# Patient Record
Sex: Female | Born: 1985 | Race: Black or African American | Hispanic: No | Marital: Single | Smoking: Current every day smoker
Health system: Southern US, Community
[De-identification: ages and names within clinical notes are randomized; demographics above are authoritative.]

---

## 2006-05-08 ENCOUNTER — Ambulatory Visit: Payer: Self-pay | Admitting: Gynecology

## 2006-05-08 ENCOUNTER — Encounter (INDEPENDENT_AMBULATORY_CARE_PROVIDER_SITE_OTHER): Payer: Self-pay | Admitting: *Deleted

## 2008-04-05 ENCOUNTER — Emergency Department (HOSPITAL_COMMUNITY): Admission: EM | Admit: 2008-04-05 | Discharge: 2008-04-05 | Payer: Self-pay | Admitting: Emergency Medicine

## 2018-05-28 DIAGNOSIS — L723 Sebaceous cyst: Secondary | ICD-10-CM | POA: Insufficient documentation

## 2018-06-04 ENCOUNTER — Other Ambulatory Visit: Payer: Self-pay

## 2018-06-04 ENCOUNTER — Encounter (HOSPITAL_BASED_OUTPATIENT_CLINIC_OR_DEPARTMENT_OTHER): Payer: Self-pay | Admitting: *Deleted

## 2018-06-04 NOTE — H&P (Signed)
  Subjective:     Patient ID: Brittany Burnett is a 32 y.o. female.  HPI  Referred by Dr. Sharyn LullHaverstock for evaluation scalp lesion present since laste 2017. History cyst, underwent injection with steroid x 2, patient reports this made worse. Underwent I&D one month ago. Notes mass recurred.   Lives alone. Drives [employer] three days per week.  Review of Systems + scalp mass Remainder 12 point review negative    Objective:   Physical Exam  Constitutional: She is oriented to person, place, and time.  Cardiovascular: Normal rate, regular rhythm and normal heart sounds.   Pulmonary/Chest: Effort normal and breath sounds normal.  Neurological: She is alert and oriented to person, place, and time.  Skin:  Fitzpatrick 6  HEENT: right parietal scalp mass 1.5.x 3.5 cm, no hair over mass itself, no drainage or cellulitis  RUE with two linear flat scars with hyperpigmentation    Assessment:     Cyst right scalp    Plan:     Given size recommend excision in OR. Do not plan to shave hair, discussed scar itself may not grow hair. Discussed will excise some of the redundant skin that has alopecia. Reviewed risks recurrence, infection, seroma, hematoma.  With regards to her UE scars, counseled they are flat and narrow, any revision of scar would offer minimal improvement. Counseled the scar hyperpigmentation is common with her skin type and excision will not improve this.   Plan OP surgery. She does not work on weekends, desires Friday.  Glenna FellowsBrinda Canna Nickelson, MD [facility] Plastic [facility] Re[facility]ive Surgery [phone], pin [phone]

## 2018-06-06 ENCOUNTER — Encounter (HOSPITAL_BASED_OUTPATIENT_CLINIC_OR_DEPARTMENT_OTHER)
Admission: RE | Disposition: A | Discharge status: Home / Self Care | Payer: Self-pay | Source: Ambulatory Visit | Attending: Plastic Surgery

## 2018-06-06 ENCOUNTER — Ambulatory Visit (HOSPITAL_BASED_OUTPATIENT_CLINIC_OR_DEPARTMENT_OTHER): Payer: BLUE CROSS/BLUE SHIELD | Admitting: Certified Registered"

## 2018-06-06 ENCOUNTER — Ambulatory Visit (HOSPITAL_BASED_OUTPATIENT_CLINIC_OR_DEPARTMENT_OTHER)
Admission: RE | Admit: 2018-06-06 | Discharge: 2018-06-06 | Disposition: A | Discharge status: Home / Self Care | Payer: BLUE CROSS/BLUE SHIELD | Source: Ambulatory Visit | Attending: Plastic Surgery | Admitting: Plastic Surgery

## 2018-06-06 ENCOUNTER — Encounter (HOSPITAL_BASED_OUTPATIENT_CLINIC_OR_DEPARTMENT_OTHER): Payer: Self-pay | Admitting: Certified Registered"

## 2018-06-06 ENCOUNTER — Other Ambulatory Visit: Payer: Self-pay

## 2018-06-06 DIAGNOSIS — F17200 Nicotine dependence, unspecified, uncomplicated: Secondary | ICD-10-CM | POA: Insufficient documentation

## 2018-06-06 DIAGNOSIS — L728 Other follicular cysts of the skin and subcutaneous tissue: Secondary | ICD-10-CM | POA: Insufficient documentation

## 2018-06-06 HISTORY — PX: LESION REMOVAL: SHX5196

## 2018-06-06 LAB — POCT PREGNANCY, URINE: PREG TEST UR: NEGATIVE

## 2018-06-06 SURGERY — WIDE EXCISION, LESION, UPPER EXTREMITY
Anesthesia: General | Site: Scalp

## 2018-06-06 MED ORDER — BUPIVACAINE-EPINEPHRINE 0.25% -1:200000 IJ SOLN
INTRAMUSCULAR | Status: DC | PRN
  Administered 2018-06-06: 8 mL

## 2018-06-06 MED ORDER — HYDROCODONE-ACETAMINOPHEN 5-325 MG PO TABS: 1.0000 | ORAL_TABLET | ORAL | 0 refills | Status: AC | PRN

## 2018-06-06 MED ORDER — LABETALOL HCL 5 MG/ML IV SOLN: 10.0000 mg | INTRAVENOUS | Status: DC | PRN

## 2018-06-06 MED ORDER — MIDAZOLAM HCL 2 MG/2ML IJ SOLN
INTRAMUSCULAR | Status: AC
  Filled 2018-06-06: qty 2

## 2018-06-06 MED ORDER — ACETAMINOPHEN 500 MG PO TABS: 1000.0000 mg | ORAL_TABLET | Freq: Once | ORAL | Status: DC

## 2018-06-06 MED ORDER — SCOPOLAMINE 1 MG/3DAYS TD PT72: 1.0000 | MEDICATED_PATCH | Freq: Once | TRANSDERMAL | Status: DC | PRN

## 2018-06-06 MED ORDER — CEFAZOLIN SODIUM-DEXTROSE 2-4 GM/100ML-% IV SOLN
INTRAVENOUS | Status: AC
  Filled 2018-06-06: qty 100

## 2018-06-06 MED ORDER — ONDANSETRON HCL 4 MG/2ML IJ SOLN
INTRAMUSCULAR | Status: DC | PRN
  Administered 2018-06-06: 4 mg via INTRAVENOUS

## 2018-06-06 MED ORDER — LABETALOL HCL 5 MG/ML IV SOLN
10.0000 mg | INTRAVENOUS | Status: AC | PRN
  Administered 2018-06-06 (×2): 10 mg via INTRAVENOUS

## 2018-06-06 MED ORDER — DEXAMETHASONE SODIUM PHOSPHATE 4 MG/ML IJ SOLN
INTRAMUSCULAR | Status: DC | PRN
  Administered 2018-06-06: 10 mg via INTRAVENOUS

## 2018-06-06 MED ORDER — LABETALOL HCL 5 MG/ML IV SOLN
INTRAVENOUS | Status: AC
  Filled 2018-06-06: qty 4

## 2018-06-06 MED ORDER — BACITRACIN ZINC 500 UNIT/GM EX OINT
TOPICAL_OINTMENT | CUTANEOUS | Status: AC
  Filled 2018-06-06: qty 0.9

## 2018-06-06 MED ORDER — PROMETHAZINE HCL 25 MG/ML IJ SOLN: 6.2500 mg | INTRAMUSCULAR | Status: DC | PRN

## 2018-06-06 MED ORDER — LACTATED RINGERS IV SOLN
INTRAVENOUS | Status: DC
  Administered 2018-06-06 (×2): via INTRAVENOUS

## 2018-06-06 MED ORDER — MIDAZOLAM HCL 2 MG/2ML IJ SOLN
1.0000 mg | INTRAMUSCULAR | Status: DC | PRN
  Administered 2018-06-06: 2 mg via INTRAVENOUS

## 2018-06-06 MED ORDER — 0.9 % SODIUM CHLORIDE (POUR BTL) OPTIME
TOPICAL | Status: DC | PRN
  Administered 2018-06-06: 50 mL

## 2018-06-06 MED ORDER — CEFAZOLIN SODIUM-DEXTROSE 2-4 GM/100ML-% IV SOLN
2.0000 g | INTRAVENOUS | Status: AC
  Administered 2018-06-06: 2 g via INTRAVENOUS

## 2018-06-06 MED ORDER — FENTANYL CITRATE (PF) 100 MCG/2ML IJ SOLN: 25.0000 ug | INTRAMUSCULAR | Status: DC | PRN

## 2018-06-06 MED ORDER — FENTANYL CITRATE (PF) 100 MCG/2ML IJ SOLN
INTRAMUSCULAR | Status: AC
  Filled 2018-06-06: qty 2

## 2018-06-06 MED ORDER — LIDOCAINE HCL (CARDIAC) PF 100 MG/5ML IV SOSY
PREFILLED_SYRINGE | INTRAVENOUS | Status: DC | PRN
  Administered 2018-06-06: 60 mg via INTRAVENOUS

## 2018-06-06 MED ORDER — PROPOFOL 10 MG/ML IV BOLUS
INTRAVENOUS | Status: DC | PRN
  Administered 2018-06-06: 200 mg via INTRAVENOUS

## 2018-06-06 MED ORDER — BACITRACIN 500 UNIT/GM EX OINT
TOPICAL_OINTMENT | CUTANEOUS | Status: DC | PRN
  Administered 2018-06-06: 1 via TOPICAL

## 2018-06-06 MED ORDER — FENTANYL CITRATE (PF) 100 MCG/2ML IJ SOLN
50.0000 ug | INTRAMUSCULAR | Status: DC | PRN
  Administered 2018-06-06: 100 ug via INTRAVENOUS

## 2018-06-06 SURGICAL SUPPLY — 49 items
ADH SKN CLS APL DERMABOND .7 (GAUZE/BANDAGES/DRESSINGS)
BALL CTTN LRG ABS STRL LF (GAUZE/BANDAGES/DRESSINGS)
BLADE CLIPPER SURG (BLADE) IMPLANT
BLADE SURG 15 STRL LF DISP TIS (BLADE) ×1 IMPLANT
BLADE SURG 15 STRL SS (BLADE) ×3
CLOSURE WOUND 1/2 X4 (GAUZE/BANDAGES/DRESSINGS)
CLOSURE WOUND 1/4X4 (GAUZE/BANDAGES/DRESSINGS)
COTTONBALL LRG STERILE PKG (GAUZE/BANDAGES/DRESSINGS) IMPLANT
COVER BACK TABLE 60X90IN (DRAPES) ×3 IMPLANT
COVER MAYO STAND STRL (DRAPES) ×3 IMPLANT
DECANTER SPIKE VIAL GLASS SM (MISCELLANEOUS) IMPLANT
DERMABOND ADVANCED (GAUZE/BANDAGES/DRESSINGS)
DERMABOND ADVANCED .7 DNX12 (GAUZE/BANDAGES/DRESSINGS) IMPLANT
DRAPE U-SHAPE 76X120 STRL (DRAPES) ×3 IMPLANT
ELECT COATED BLADE 2.86 ST (ELECTRODE) IMPLANT
ELECT NDL BLADE 2-5/6 (NEEDLE) ×1 IMPLANT
ELECT NEEDLE BLADE 2-5/6 (NEEDLE) ×3 IMPLANT
ELECT REM PT RETURN 9FT ADLT (ELECTROSURGICAL) ×3
ELECTRODE REM PT RTRN 9FT ADLT (ELECTROSURGICAL) ×1 IMPLANT
GAUZE XEROFORM 1X8 LF (GAUZE/BANDAGES/DRESSINGS) IMPLANT
GAUZE XEROFORM 5X9 LF (GAUZE/BANDAGES/DRESSINGS) IMPLANT
GLOVE BIO SURGEON STRL SZ 6 (GLOVE) ×5 IMPLANT
GLOVE BIO SURGEON STRL SZ 6.5 (GLOVE) IMPLANT
GLOVE BIO SURGEONS STRL SZ 6.5 (GLOVE)
GLOVE EXAM NITRILE LRG STRL (GLOVE) ×2 IMPLANT
GLOVE SURG SS PI 7.0 STRL IVOR (GLOVE) ×4 IMPLANT
GOWN STRL REUS W/ TWL LRG LVL3 (GOWN DISPOSABLE) ×2 IMPLANT
GOWN STRL REUS W/TWL LRG LVL3 (GOWN DISPOSABLE) ×9
NDL PRECISIONGLIDE 27X1.5 (NEEDLE) ×1 IMPLANT
NEEDLE PRECISIONGLIDE 27X1.5 (NEEDLE) ×3 IMPLANT
PACK BASIN DAY SURGERY FS (CUSTOM PROCEDURE TRAY) ×3 IMPLANT
PENCIL BUTTON HOLSTER BLD 10FT (ELECTRODE) ×3 IMPLANT
SHEET MEDIUM DRAPE 40X70 STRL (DRAPES) IMPLANT
SLEEVE SCD COMPRESS KNEE MED (MISCELLANEOUS) ×2 IMPLANT
SPONGE GAUZE 2X2 8PLY STER LF (GAUZE/BANDAGES/DRESSINGS)
SPONGE GAUZE 2X2 8PLY STRL LF (GAUZE/BANDAGES/DRESSINGS) IMPLANT
STRIP CLOSURE SKIN 1/2X4 (GAUZE/BANDAGES/DRESSINGS) IMPLANT
STRIP CLOSURE SKIN 1/4X4 (GAUZE/BANDAGES/DRESSINGS) IMPLANT
SUT CHROMIC 4 0 PS 2 18 (SUTURE) ×4 IMPLANT
SUT MNCRL AB 3-0 PS2 18 (SUTURE) ×2 IMPLANT
SUT PDS AB 2-0 CT2 27 (SUTURE) ×2 IMPLANT
SUT PLAIN 5 0 P 3 18 (SUTURE) IMPLANT
SUT PROLENE 5 0 P 3 (SUTURE) IMPLANT
SUT PROLENE 5 0 PS 2 (SUTURE) IMPLANT
SUT PROLENE 6 0 P 1 18 (SUTURE) IMPLANT
SYR BULB 3OZ (MISCELLANEOUS) IMPLANT
SYR CONTROL 10ML LL (SYRINGE) ×3 IMPLANT
TOWEL GREEN STERILE FF (TOWEL DISPOSABLE) ×3 IMPLANT
TRAY DSU PREP LF (CUSTOM PROCEDURE TRAY) ×3 IMPLANT

## 2018-06-06 NOTE — Interval H&P Note (Signed)
History and Physical Interval Note:  06/06/2018 7:19 AM  Brittany Burnett  has presented today for surgery, with the diagnosis of cyst scalp  The various methods of treatment have been discussed with the patient and family. After consideration of risks, benefits and other options for treatment, the patient has consented to  Excision scalp lesion/cyst as a surgical intervention .  The patient's history has been reviewed, patient examined, no change in status, stable for surgery.  I have reviewed the patient's chart and labs.  Questions were answered to the patient's satisfaction.     [name]

## 2018-06-06 NOTE — Op Note (Signed)
Operative Note   DATE OF OPERATION: 7.26.2019  LOCATION:  [facility]  SURGICAL DIVISION: Plastic Surgery  PREOPERATIVE DIAGNOSES:  Cyst scalp  POSTOPERATIVE DIAGNOSES:  same  PROCEDURE:  1. Excision benign lesion scalp 2.2 cm 2. Layered closure scalp 5 cm  SURGEON: Glenna FellowsBrinda Jsoeph Podesta MD MBA  ASSISTANT: none  ANESTHESIA:  General.   EBL: 20 ml  COMPLICATIONS: None immediate.   INDICATIONS FOR PROCEDURE:  The patient, Brittany Burnett, is a 32 y.o. female born on 05/22/1986, is here for excision cyst scalp with prior steroid injection, infection.    FINDINGS: Right parietal scalp cyst with central punctum, copious white fluid, walls with pseudo epithelization and chronic scar. Additional swelling consistent with cyst noted posterior to lesion of concern, this did not communicate with lesion addressed today.  DESCRIPTION OF PROCEDURE:  The patient's operative site was marked with the patient in the preoperative area. The patient was taken to the operating room. SCDs were placed and IV antibiotics were given. The patient's operative site was prepped and draped in a sterile fashion. A time out was performed and all information was confirmed to be correct. Local anesthetic infiltrated surrounding mass. Elliptical skin excision including punctum completed.Chronic cavity with scar noted and cavity excised to normal appearing subcutaneous tissue. Additional scar excised and redundant skin excised. Cavity irrigated and hemostasis ensured. Additional local anesthetic infiltrated in wound base. Closure completed with 2-0 PDS in galea, 3-0 monocryl in dermis, and running 4-0 chromic for skin closure, length 5 cm. Antibiotic ointment applied.  The patient was allowed to wake from anesthesia, extubated and taken to the recovery room in satisfactory condition.   SPECIMENS: scalp cyst  DRAINS: none  Glenna FellowsBrinda Yeshaya Vath, MD [facility] Re[facility] Surgery [phone], pin [phone]

## 2018-06-06 NOTE — Transfer of Care (Signed)
Immediate Anesthesia Transfer of Care Note  Patient: Brittany Burnett  Procedure(s) Performed: Excision benign lesion scalp 2 cm, layered closure scalp 5 cm (N/A Scalp)  Patient Location: PACU  Anesthesia Type:General  Level of Consciousness: awake, alert , oriented and patient cooperative  Airway & Oxygen Therapy: Patient Spontanous Breathing and Patient connected to face mask oxygen  Post-op Assessment: Report given to RN and Post -op Vital signs reviewed and stable  Post vital signs: Reviewed and stable  Last Vitals:  Vitals Value Taken Time  BP    Temp    Pulse 148 06/06/2018  9:40 AM  Resp 20 06/06/2018  9:40 AM  SpO2 100 % 06/06/2018  9:40 AM  Vitals shown include unvalidated device data.  Last Pain:  Vitals:   06/06/18 0754  TempSrc: Oral  PainSc: 0-No pain      Patients Stated Pain Goal: 3 (06/06/18 0754)  Complications: No apparent anesthesia complications

## 2018-06-06 NOTE — Anesthesia Procedure Notes (Signed)
Procedure Name: LMA Insertion Date/Time: 06/06/2018 8:42 AM Performed by: Sheryn BisonBlocker, Tanji Storrs D, CRNA Pre-anesthesia Checklist: Patient identified, Emergency Drugs available, Suction available and Patient being monitored Patient Re-evaluated:Patient Re-evaluated prior to induction Oxygen Delivery Method: Circle system utilized Preoxygenation: Pre-oxygenation with 100% oxygen Induction Type: IV induction Ventilation: Mask ventilation without difficulty LMA: LMA inserted LMA Size: 4.0 Number of attempts: 1 Airway Equipment and Method: Bite block Placement Confirmation: positive ETCO2 Tube secured with: Tape Dental Injury: Teeth and Oropharynx as per pre-operative assessment

## 2018-06-06 NOTE — Anesthesia Preprocedure Evaluation (Addendum)
Anesthesia Evaluation  Patient identified by MRN, date of birth, ID band Patient awake    Reviewed: Allergy & Precautions, NPO status , Patient's Chart, lab work & pertinent test results  Airway Mallampati: II  TM Distance: >3 FB Neck ROM: Full    Dental  (+) Teeth Intact, Dental Advisory Given   Pulmonary Current Smoker,    Pulmonary exam normal breath sounds clear to auscultation       Cardiovascular Exercise Tolerance: Good negative cardio ROS Normal cardiovascular exam Rhythm:Regular Rate:Normal     Neuro/Psych negative neurological ROS  negative psych ROS   GI/Hepatic negative GI ROS, Neg liver ROS,   Endo/Other  negative endocrine ROSObesity   Renal/GU negative Renal ROS     Musculoskeletal negative musculoskeletal ROS (+)   Abdominal   Peds  Hematology   Anesthesia Other Findings Day of surgery medications reviewed with the patient.  Reproductive/Obstetrics negative OB ROS                            Anesthesia Physical Anesthesia Plan  ASA: II  Anesthesia Plan: General   Post-op Pain Management:    Induction: Intravenous  PONV Risk Score and Plan: 2 and Dexamethasone and Ondansetron  Airway Management Planned: LMA  Additional Equipment:   Intra-op Plan:   Post-operative Plan: Extubation in OR  Informed Consent: I have reviewed the patients History and Physical, chart, labs and discussed the procedure including the risks, benefits and alternatives for the proposed anesthesia with the patient or authorized representative who has indicated his/her understanding and acceptance.   Dental advisory given  Plan Discussed with: CRNA  Anesthesia Plan Comments:         Anesthesia Quick Evaluation

## 2018-06-06 NOTE — Discharge Instructions (Signed)

## 2018-06-06 NOTE — Anesthesia Postprocedure Evaluation (Signed)
Anesthesia Post Note  Patient: Brittany Burnett  Procedure(s) Performed: Excision benign lesion scalp 2 cm, layered closure scalp 5 cm (N/A Scalp)     Patient location during evaluation: PACU Anesthesia Type: General Level of consciousness: awake and alert Pain management: pain level controlled Vital Signs Assessment: post-procedure vital signs reviewed and stable Respiratory status: spontaneous breathing, nonlabored ventilation and respiratory function stable Cardiovascular status: blood pressure returned to baseline and stable Postop Assessment: no apparent nausea or vomiting Anesthetic complications: no    Last Vitals:  Vitals:   06/06/18 1048 06/06/18 1115  BP: (!) 132/96 (!) 138/94  Pulse: 80 75  Resp: 16 20  Temp:  37 C  SpO2: 100% 100%    Last Pain:  Vitals:   06/06/18 1100  TempSrc:   PainSc: 0-No pain                 Cecile HearingStephen Edward Nahla Lukin

## 2018-06-08 ENCOUNTER — Encounter (HOSPITAL_COMMUNITY): Payer: Self-pay | Admitting: Emergency Medicine

## 2018-06-08 ENCOUNTER — Ambulatory Visit (HOSPITAL_COMMUNITY)
Admission: EM | Admit: 2018-06-08 | Discharge: 2018-06-08 | Disposition: A | Discharge status: Home / Self Care | Payer: BLUE CROSS/BLUE SHIELD | Attending: Internal Medicine | Admitting: Internal Medicine

## 2018-06-08 DIAGNOSIS — Z5189 Encounter for other specified aftercare: Secondary | ICD-10-CM

## 2018-06-08 DIAGNOSIS — S0000XA Unspecified superficial injury of scalp, initial encounter: Secondary | ICD-10-CM

## 2018-06-08 NOTE — ED Triage Notes (Signed)
Pt states "Friday I had an outpatient procedure to have a cyst removed from my scalp, and I slept on the R side fo my head and may have opened the incision. They closed it with dissolvable sutures."

## 2018-06-08 NOTE — ED Provider Notes (Signed)
[facility]    CSN: [id number] Arrival date & time: 06/08/18  1010     History   Chief Complaint Chief Complaint  Patient presents with  . Wound Check    HPI Brittany Burnett is a 32 y.o. female.   History of tobacco use presenting today for evaluation of a wound.  Patient states that on Friday, approximately 2 days ago she had excision of a cyst to her scalp.  Wound was closed with absorbable sutures.  Patient felt like she slept on the right side of her head on Saturday, and woke up this morning with blood on her pillow and concerned that she messed up the sutures.  She has had minimal pain.  Denies any fevers.  Denies headaches.     History reviewed. No pertinent past medical history.  There are no active problems to display for this patient.   History reviewed. No pertinent surgical history.  OB History   None      Home Medications    Prior to Admission medications   Medication Sig Start Date End Date Taking? Authorizing Provider  HYDROcodone-acetaminophen (NORCO/VICODIN) 5-325 MG tablet Take 1 tablet by mouth every 4 (four) hours as needed for moderate pain. 06/06/18 06/06/19  Glenna Fellows, MD  ibuprofen (ADVIL,MOTRIN) 100 MG/5ML suspension Take 200 mg by mouth every 4 (four) hours as needed for mild pain or moderate pain.    [provider]    Family History No family history on file.  Social History Social History   Tobacco Use  . Smoking status: Current Every Day Smoker    Packs/day: 0.25    Types: Cigarettes  . Smokeless tobacco: Never Used  . Tobacco comment: Smoke 1 pack a week  Substance Use Topics  . Alcohol use: Yes    Comment: socially  . Drug use: Never     Allergies   Patient has no known allergies.   Review of Systems Review of Systems  Constitutional: Negative for fatigue and fever.  Eyes: Negative for visual disturbance.  Respiratory: Negative for shortness of breath.   Cardiovascular: Negative for chest  pain.  Gastrointestinal: Negative for abdominal pain, nausea and vomiting.  Musculoskeletal: Negative for arthralgias and joint swelling.  Skin: Positive for wound. Negative for color change and rash.  Neurological: Negative for dizziness, weakness, light-headedness and headaches.     Physical Exam Triage Vital Signs ED Triage Vitals [06/08/18 1101]  Enc Vitals Group     BP (!) 153/104     Pulse Rate 60     Resp 18     Temp 97.9 F (36.6 C)     Temp src      SpO2 100 %     Weight      Height      Head Circumference      Peak Flow      Pain Score      Pain Loc      Pain Edu?      Excl. in GC?    No data found.  Updated Vital Signs BP (!) 153/104   Pulse 60   Temp 97.9 F (36.6 C)   Resp 18   LMP 05/27/2018 (Exact Date)   SpO2 100%   Visual Acuity Right Eye Distance:   Left Eye Distance:   Bilateral Distance:    Right Eye Near:   Left Eye Near:    Bilateral Near:     Physical Exam  Constitutional: She is oriented to  person, place, and time. She appears well-developed and well-nourished.  No acute distress  HENT:  Head: Normocephalic and atraumatic.  Nose: Nose normal.  Eyes: Conjunctivae are normal.  Neck: Neck supple.  Cardiovascular: Normal rate.  Pulmonary/Chest: Effort normal. No respiratory distress.  Abdominal: She exhibits no distension.  Musculoskeletal: Normal range of motion.  Neurological: She is alert and oriented to person, place, and time.  Skin: Skin is warm and dry.  3 cm wound to right parietal scalp area, appears to be closed well, scant amount of red crusting drainage to inferior aspect of wound, but not actively draining, mild swelling to superior aspect of wound  Psychiatric: She has a normal mood and affect.  Nursing note and vitals reviewed.      UC Treatments / Results  Labs (all labs ordered are listed, but only abnormal results are displayed) Labs Reviewed - No data to display  EKG None  Radiology No results  found.  Procedures Procedures (including critical care time)  Medications Ordered in UC Medications - No data to display  Initial Impression / Assessment and Plan / UC Course  I have reviewed the triage vital signs and the nursing notes.  Pertinent labs & imaging results that were available during my care of the patient were reviewed by me and considered in my medical decision making (see chart for details).     Wound remains closed, no signs of infection.  Appears to be healing well.  At this time will recommend continuing wound care and continuing to monitor for healing.Discussed strict return precautions. Patient verbalized understanding and is agreeable with plan.  Final Clinical Impressions(s) / UC Diagnoses   Final diagnoses:  Visit for wound check     Discharge Instructions     Please keep clean and dry- wash gently with warm soapy water, dry well but gently after cleaning  Please follow up if having worsening pain, swelling or drainage but otherwise wound appears to be healing well    ED Prescriptions    None     Controlled Substance Prescriptions [place] Controlled Substance Registry consulted? Not Applicable   Lew DawesWieters, Hallie C, [place]-C 06/08/18 1139

## 2018-06-08 NOTE — Discharge Instructions (Signed)
Please keep clean and dry- wash gently with warm soapy water, dry well but gently after cleaning  Please follow up if having worsening pain, swelling or drainage but otherwise wound appears to be healing well

## 2018-06-09 ENCOUNTER — Encounter (HOSPITAL_BASED_OUTPATIENT_CLINIC_OR_DEPARTMENT_OTHER): Payer: Self-pay | Admitting: Plastic Surgery

## 2018-06-17 ENCOUNTER — Encounter (HOSPITAL_COMMUNITY): Payer: Self-pay | Admitting: Emergency Medicine

## 2018-06-17 ENCOUNTER — Ambulatory Visit (HOSPITAL_COMMUNITY)
Admission: EM | Admit: 2018-06-17 | Discharge: 2018-06-17 | Disposition: A | Discharge status: Home / Self Care | Payer: BLUE CROSS/BLUE SHIELD | Attending: Family Medicine | Admitting: Family Medicine

## 2018-06-17 ENCOUNTER — Emergency Department (HOSPITAL_COMMUNITY)
Admission: EM | Admit: 2018-06-17 | Discharge: 2018-06-17 | Disposition: A | Discharge status: Home / Self Care | Payer: BLUE CROSS/BLUE SHIELD | Attending: Emergency Medicine | Admitting: Emergency Medicine

## 2018-06-17 DIAGNOSIS — F17210 Nicotine dependence, cigarettes, uncomplicated: Secondary | ICD-10-CM | POA: Insufficient documentation

## 2018-06-17 DIAGNOSIS — Z79899 Other long term (current) drug therapy: Secondary | ICD-10-CM | POA: Diagnosis not present

## 2018-06-17 DIAGNOSIS — Z5189 Encounter for other specified aftercare: Secondary | ICD-10-CM | POA: Diagnosis not present

## 2018-06-17 DIAGNOSIS — Y658 Other specified misadventures during surgical and medical care: Secondary | ICD-10-CM | POA: Diagnosis not present

## 2018-06-17 DIAGNOSIS — L02811 Cutaneous abscess of head [any part, except face]: Secondary | ICD-10-CM | POA: Insufficient documentation

## 2018-06-17 DIAGNOSIS — S0100XD Unspecified open wound of scalp, subsequent encounter: Secondary | ICD-10-CM | POA: Diagnosis not present

## 2018-06-17 DIAGNOSIS — T8131XA Disruption of external operation (surgical) wound, not elsewhere classified, initial encounter: Secondary | ICD-10-CM | POA: Diagnosis present

## 2018-06-17 DIAGNOSIS — L0291 Cutaneous abscess, unspecified: Secondary | ICD-10-CM

## 2018-06-17 MED ORDER — SULFAMETHOXAZOLE-TRIMETHOPRIM 800-160 MG PO TABS: 1.0000 | ORAL_TABLET | Freq: Two times a day (BID) | ORAL | 0 refills | Status: AC

## 2018-06-17 NOTE — ED Triage Notes (Signed)
Pt here for have wound check from cyst removed from back of head

## 2018-06-17 NOTE — ED Notes (Signed)
Declined W/C at D/C and was escorted to lobby by RN. 

## 2018-06-17 NOTE — ED Triage Notes (Signed)
Patient requesting incision at right scalp sutured , she noticed small gap at incision site with no bleeding ( cyst excision 7/26) this morning .

## 2018-06-17 NOTE — Discharge Instructions (Addendum)
Warm compresses to area for 20 minutes at a time. Take antibiotics as prescribed and complete the full course. Recheck with your PCP or surgeon.

## 2018-06-17 NOTE — Discharge Instructions (Addendum)
Continue antibiotic ointment, also allow period of time exposed to air  Return if developing pain, increased drainage from area

## 2018-06-17 NOTE — ED Provider Notes (Addendum)
[facility]    CSN: [id number] Arrival date & time: 06/17/18  1847     History   Chief Complaint Chief Complaint  Patient presents with  . Wound Check    HPI Brittany Burnett is a 32 y.o. female no significant past medical history presenting today for evaluation of a wound check.  Patient had a cyst removed from her scalp approximately 1-1/2 weeks ago.  She is presenting today she is concerned about the wound.  She states that yesterday she was seen by her surgeon and advised to apply antibiotic ointment as well as continue wound care and advised that the wound is healing well.  She states that this morning she was in a hurry and pulled her hair back, she is concerned that when she did that she opened the wound further and has also noticed that wound to be "wet".  She denies any associated pain with this wound.  HPI  History reviewed. No pertinent past medical history.  There are no active problems to display for this patient.   Past Surgical History:  Procedure Laterality Date  . LESION REMOVAL N/A 06/06/2018   Procedure: Excision benign lesion scalp 2 cm, layered closure scalp 5 cm;  Surgeon: Glenna Fellowshimmappa, Brinda, MD;  Location: [facility];  Service: Plastics;  Laterality: N/A;    OB History   None      Home Medications    Prior to Admission medications   Medication Sig Start Date End Date Taking? Authorizing Provider  HYDROcodone-acetaminophen (NORCO/VICODIN) 5-325 MG tablet Take 1 tablet by mouth every 4 (four) hours as needed for moderate pain. 06/06/18 06/06/19  Glenna Fellowshimmappa, Brinda, MD  ibuprofen (ADVIL,MOTRIN) 100 MG/5ML suspension Take 200 mg by mouth every 4 (four) hours as needed for mild pain or moderate pain.    [provider]    Family History History reviewed. No pertinent family history.  Social History Social History   Tobacco Use  . Smoking status: Current Every Day Smoker    Packs/day: 0.25    Types: Cigarettes  .  Smokeless tobacco: Never Used  . Tobacco comment: Smoke 1 pack a week  Substance Use Topics  . Alcohol use: Yes    Comment: socially  . Drug use: Never     Allergies   Patient has no known allergies.   Review of Systems Review of Systems  Constitutional: Negative for fatigue and fever.  HENT: Negative for mouth sores.   Eyes: Negative for visual disturbance.  Respiratory: Negative for shortness of breath.   Cardiovascular: Negative for chest pain.  Gastrointestinal: Negative for abdominal pain, nausea and vomiting.  Genitourinary: Negative for genital sores.  Musculoskeletal: Negative for arthralgias and joint swelling.  Skin: Positive for wound. Negative for color change and rash.  Neurological: Negative for dizziness, weakness, light-headedness and headaches.     Physical Exam Triage Vital Signs ED Triage Vitals  Enc Vitals Group     BP 06/17/18 1937 (!) 156/105     Pulse Rate 06/17/18 1937 91     Resp 06/17/18 1937 18     Temp 06/17/18 1937 98.2 F (36.8 C)     Temp Source 06/17/18 1937 Oral     SpO2 06/17/18 1937 97 %     Weight --      Height --      Head Circumference --      Peak Flow --      Pain Score 06/17/18 1935 0  Pain Loc --      Pain Edu? --      Excl. in GC? --    No data found.  Updated Vital Signs BP (!) 156/105 (BP Location: Left Arm)   Pulse 91   Temp 98.2 F (36.8 C) (Oral)   Resp 18   LMP 05/27/2018 (Exact Date)   SpO2 97%   Visual Acuity Right Eye Distance:   Left Eye Distance:   Bilateral Distance:    Right Eye Near:   Left Eye Near:    Bilateral Near:     Physical Exam  Constitutional: She is oriented to person, place, and time. She appears well-developed and well-nourished.  No acute distress  HENT:  Head: Normocephalic and atraumatic.  Nose: Nose normal.  Eyes: Conjunctivae are normal.  Neck: Neck supple.  Cardiovascular: Normal rate.  Pulmonary/Chest: Effort normal. No respiratory distress.  Abdominal: She  exhibits no distension.  Musculoskeletal: Normal range of motion.  Neurological: She is alert and oriented to person, place, and time.  Skin: Skin is warm and dry.  Overall well-healing wound to right parietal scalp, scabbing along most of the wound, 1 and with slight opening, no tenderness to palpation, no active drainage expressed  Psychiatric: She has a normal mood and affect.  Nursing note and vitals reviewed.    See previous note for picture of wound 3 days after surgery.  UC Treatments / Results  Labs (all labs ordered are listed, but only abnormal results are displayed) Labs Reviewed - No data to display  EKG None  Radiology No results found.  Procedures Procedures (including critical care time)  Medications Ordered in UC Medications - No data to display  Initial Impression / Assessment and Plan / UC Course  I have reviewed the triage vital signs and the nursing notes.  Pertinent labs & imaging results that were available during my care of the patient were reviewed by me and considered in my medical decision making (see chart for details).     Wound to right scalp, no obvious sign of infection although opening appears increased from previous visit, given surgery approximately 1-1/2 weeks out, do not recommend putting further stitches in, advised patient that this wound should still continue to heal.  Continue to apply bacitracin/antibiotic ointment to wound.  Follow-up if developing drainage, pain, fevers. Final Clinical Impressions(s) / UC Diagnoses   Final diagnoses:  Visit for wound check     Discharge Instructions     Continue antibiotic ointment, also allow period of time exposed to air  Return if developing pain, increased drainage from area   ED Prescriptions    None     Controlled Substance Prescriptions [place] Controlled Substance Registry consulted? Not Applicable   Lew Dawes, PA-C 06/17/18 2031    Lew Dawes, [place] 06/17/18  2034

## 2018-06-17 NOTE — ED Provider Notes (Signed)
[facility] EMERGENCY [facility] Provider Note   CSN: [id number] Arrival date & time: 06/17/18  2016     History   Chief Complaint Chief Complaint  Patient presents with  . Wound Check    HPI Brittany Burnett is a 32 y.o. female.  32 year old female presents with complaint of open wound on her scalp.  Patient states that she had a cyst removed from her scalp about a week and a half ago and today while putting her hair up noticed the incision site opened.  Patient went to urgent care tonight and was told that the wound could not be sutured so she came to the emergency room.  No other complaints or concerns.     History reviewed. No pertinent past medical history.  There are no active problems to display for this patient.   Past Surgical History:  Procedure Laterality Date  . LESION REMOVAL N/A 06/06/2018   Procedure: Excision benign lesion scalp 2 cm, layered closure scalp 5 cm;  Surgeon: Glenna Fellowshimmappa, Brinda, MD;  Location: [facility];  Service: Plastics;  Laterality: N/A;     OB History   None      Home Medications    Prior to Admission medications   Medication Sig Start Date End Date Taking? Authorizing Provider  HYDROcodone-acetaminophen (NORCO/VICODIN) 5-325 MG tablet Take 1 tablet by mouth every 4 (four) hours as needed for moderate pain. 06/06/18 06/06/19  Glenna Fellowshimmappa, Brinda, MD  ibuprofen (ADVIL,MOTRIN) 100 MG/5ML suspension Take 200 mg by mouth every 4 (four) hours as needed for mild pain or moderate pain.    [provider]  sulfamethoxazole-trimethoprim (BACTRIM DS,SEPTRA DS) 800-160 MG tablet Take 1 tablet by mouth 2 (two) times daily for 10 days. 06/17/18 06/27/18  Jeannie FendMurphy, Keland Peyton A, PA-C    Family History No family history on file.  Social History Social History   Tobacco Use  . Smoking status: Current Every Day Smoker    Packs/day: 0.25    Types: Cigarettes  . Smokeless tobacco: Never Used  . Tobacco comment: Smoke 1  pack a week  Substance Use Topics  . Alcohol use: Yes    Comment: socially  . Drug use: Never     Allergies   Patient has no known allergies.   Review of Systems Review of Systems  Constitutional: Negative for fever.  Skin: Positive for wound.  Allergic/Immunologic: Negative for immunocompromised state.  Neurological: Negative for headaches.  Hematological: Does not bruise/bleed easily.  Psychiatric/Behavioral: Negative for confusion.  All other systems reviewed and are negative.    Physical Exam Updated Vital Signs BP (!) 136/108 (BP Location: Right Arm)   Pulse 87   Temp 99.7 F (37.6 C) (Oral)   Resp 18   LMP 05/27/2018 (Exact Date)   SpO2 100%   Physical Exam  Constitutional: She is oriented to person, place, and time. She appears well-developed and well-nourished. No distress.  HENT:  Head: Atraumatic.    Pulmonary/Chest: Effort normal.  Neurological: She is alert and oriented to person, place, and time.  Skin: Skin is warm and dry. She is not diaphoretic.  Psychiatric: She has a normal mood and affect. Her behavior is normal.  Nursing note and vitals reviewed.    ED Treatments / Results  Labs (all labs ordered are listed, but only abnormal results are displayed) Labs Reviewed - No data to display  EKG None  Radiology No results found.  Procedures Procedures (including critical care time)  Medications Ordered in  ED Medications - No data to display   Initial Impression / Assessment and Plan / ED Course  I have reviewed the triage vital signs and the nursing notes.  Pertinent labs & imaging results that were available during my care of the patient were reviewed by me and considered in my medical decision making (see chart for details).  Clinical Course as of Jun 18 2127  Tue Jun 17, 2018  4822 32 year old female with wound to right parietal scalp.  Patient had a cyst removed a week and a half ago, now with wound dehiscence and purulent  drainage.  Recommend warm compresses, patient given Septra.  Recommend recheck with her surgeon or PCP in 2 days.   [LM]    Clinical Course User Index [LM] Jeannie Fend, PA-C   Final Clinical Impressions(s) / ED Diagnoses   Final diagnoses:  Abscess    ED Discharge Orders        Ordered    sulfamethoxazole-trimethoprim (BACTRIM DS,SEPTRA DS) 800-160 MG tablet  2 times daily     06/17/18 2124       Jeannie Fend, PA-C 06/17/18 2128    Pricilla Loveless, MD 06/19/18 2355

## 2018-10-07 ENCOUNTER — Emergency Department (HOSPITAL_COMMUNITY): Payer: BLUE CROSS/BLUE SHIELD

## 2018-10-07 ENCOUNTER — Other Ambulatory Visit: Payer: Self-pay

## 2018-10-07 ENCOUNTER — Encounter (HOSPITAL_COMMUNITY): Payer: Self-pay | Admitting: Emergency Medicine

## 2018-10-07 ENCOUNTER — Emergency Department (HOSPITAL_COMMUNITY)
Admission: EM | Admit: 2018-10-07 | Discharge: 2018-10-07 | Disposition: A | Discharge status: Home / Self Care | Payer: BLUE CROSS/BLUE SHIELD | Attending: Emergency Medicine | Admitting: Emergency Medicine

## 2018-10-07 DIAGNOSIS — Y998 Other external cause status: Secondary | ICD-10-CM | POA: Insufficient documentation

## 2018-10-07 DIAGNOSIS — T148XXA Other injury of unspecified body region, initial encounter: Secondary | ICD-10-CM | POA: Diagnosis not present

## 2018-10-07 DIAGNOSIS — F17210 Nicotine dependence, cigarettes, uncomplicated: Secondary | ICD-10-CM | POA: Insufficient documentation

## 2018-10-07 DIAGNOSIS — Y939 Activity, unspecified: Secondary | ICD-10-CM | POA: Insufficient documentation

## 2018-10-07 DIAGNOSIS — T07XXXA Unspecified multiple injuries, initial encounter: Secondary | ICD-10-CM

## 2018-10-07 DIAGNOSIS — Y929 Unspecified place or not applicable: Secondary | ICD-10-CM | POA: Diagnosis not present

## 2018-10-07 DIAGNOSIS — S61211A Laceration without foreign body of left index finger without damage to nail, initial encounter: Secondary | ICD-10-CM | POA: Insufficient documentation

## 2018-10-07 LAB — POC URINE PREG, ED: PREG TEST UR: NEGATIVE

## 2018-10-07 LAB — RAPID HIV SCREEN (HIV 1/2 AB+AG)
HIV 1/2 ANTIBODIES: NONREACTIVE
HIV-1 P24 ANTIGEN - HIV24: NONREACTIVE

## 2018-10-07 MED ORDER — LIDOCAINE HCL (PF) 1 % IJ SOLN
10.0000 mL | Freq: Once | INTRAMUSCULAR | Status: AC
  Administered 2018-10-07: 10 mL
  Filled 2018-10-07: qty 30

## 2018-10-07 MED ORDER — METRONIDAZOLE 500 MG PO TABS
2000.0000 mg | ORAL_TABLET | Freq: Once | ORAL | Status: AC
  Administered 2018-10-07: 2000 mg via ORAL
  Filled 2018-10-07: qty 4

## 2018-10-07 MED ORDER — LIP MEDEX EX OINT
TOPICAL_OINTMENT | Freq: Once | CUTANEOUS | Status: AC
  Administered 2018-10-07: 1 via TOPICAL
  Filled 2018-10-07: qty 7

## 2018-10-07 MED ORDER — CEFTRIAXONE SODIUM 250 MG IJ SOLR
250.0000 mg | Freq: Once | INTRAMUSCULAR | Status: AC
  Administered 2018-10-07: 250 mg via INTRAMUSCULAR
  Filled 2018-10-07: qty 250

## 2018-10-07 MED ORDER — BACITRACIN ZINC 500 UNIT/GM EX OINT
TOPICAL_OINTMENT | CUTANEOUS | Status: AC
  Administered 2018-10-07: 1
  Filled 2018-10-07: qty 0.9

## 2018-10-07 MED ORDER — IBUPROFEN 200 MG PO TABS
600.0000 mg | ORAL_TABLET | Freq: Once | ORAL | Status: AC
  Administered 2018-10-07: 600 mg via ORAL
  Filled 2018-10-07: qty 3

## 2018-10-07 MED ORDER — IBUPROFEN 600 MG PO TABS: 600.0000 mg | ORAL_TABLET | Freq: Four times a day (QID) | ORAL | 0 refills | Status: AC | PRN

## 2018-10-07 MED ORDER — LIDOCAINE HCL 1 % IJ SOLN
INTRAMUSCULAR | Status: AC
  Administered 2018-10-07: 20 mL via INTRAMUSCULAR
  Filled 2018-10-07: qty 20

## 2018-10-07 MED ORDER — ONDANSETRON 4 MG PO TBDP
4.0000 mg | ORAL_TABLET | Freq: Once | ORAL | Status: AC
  Administered 2018-10-07: 4 mg via ORAL
  Filled 2018-10-07: qty 1

## 2018-10-07 MED ORDER — AZITHROMYCIN 250 MG PO TABS
1000.0000 mg | ORAL_TABLET | Freq: Once | ORAL | Status: AC
  Administered 2018-10-07: 1000 mg via ORAL
  Filled 2018-10-07: qty 4
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
## 2018-10-07 NOTE — ED Notes (Signed)
Ace wrap applied by ortho tech

## 2018-10-07 NOTE — ED Notes (Signed)
Patient transported to X-ray 

## 2018-10-07 NOTE — ED Notes (Signed)
Patient given discharge instructions. Waiting for [employer] to come to the ED to escort the patient home.
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
## 2018-10-07 NOTE — ED Notes (Signed)
[employer] at bedside will clean wound and obtain vitals when [employer] is finished with pt

## 2018-10-07 NOTE — ED Notes (Signed)
Patient discharged with [employer] escort and [facility] member.

## 2018-10-07 NOTE — SANE Note (Signed)
Follow-up Phone Call  Patient gives verbal consent for a FNE/SANE follow-up phone call in 48-72 hours: yes Patient's telephone number: [phone] - phone belongs to patient's aunt Patient gives verbal consent to leave voicemail at the phone number listed above: yes DO NOT CALL between the hours of: No time restrictions given
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
## 2018-10-07 NOTE — ED Notes (Signed)
[employer] here to interview patient
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
## 2018-10-08 LAB — RPR: RPR: NONREACTIVE

## 2018-10-14 ENCOUNTER — Encounter: Payer: Self-pay | Admitting: *Deleted

## 2018-10-21 ENCOUNTER — Ambulatory Visit: Payer: BLUE CROSS/BLUE SHIELD | Admitting: Internal Medicine

## 2018-10-27 NOTE — SANE Note (Signed)
[facility]NE attempted to contact patient for follow-up.  Left message with Forensic Nursing office call back number in case of questions.

## 2019-12-01 DIAGNOSIS — L663 Perifolliculitis capitis abscedens: Secondary | ICD-10-CM | POA: Insufficient documentation

## 2019-12-01 DIAGNOSIS — L700 Acne vulgaris: Secondary | ICD-10-CM | POA: Insufficient documentation
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
## 2021-04-27 ENCOUNTER — Other Ambulatory Visit: Payer: Self-pay | Admitting: Obstetrics & Gynecology

## 2021-05-04 ENCOUNTER — Ambulatory Visit: Payer: 59 | Admitting: Sports Medicine

## 2022-03-07 ENCOUNTER — Ambulatory Visit (INDEPENDENT_AMBULATORY_CARE_PROVIDER_SITE_OTHER): Payer: 59

## 2022-03-07 ENCOUNTER — Ambulatory Visit: Payer: 59 | Admitting: Podiatry

## 2022-03-07 DIAGNOSIS — M21612 Bunion of left foot: Secondary | ICD-10-CM

## 2022-03-07 DIAGNOSIS — Z01818 Encounter for other preprocedural examination: Secondary | ICD-10-CM

## 2022-03-07 DIAGNOSIS — M21619 Bunion of unspecified foot: Secondary | ICD-10-CM

## 2022-03-07 DIAGNOSIS — M2011 Hallux valgus (acquired), right foot: Secondary | ICD-10-CM

## 2022-03-08 ENCOUNTER — Other Ambulatory Visit: Payer: Self-pay | Admitting: Podiatry

## 2022-03-08 DIAGNOSIS — M2011 Hallux valgus (acquired), right foot: Secondary | ICD-10-CM

## 2022-03-09 NOTE — Progress Notes (Signed)
?Subjective:  ?Patient ID: Brittany Burnett, female    DOB: 1986/03/21,  MRN: [id number] ? ?No chief complaint on file. ? ? ?36 y.o. female presents with the above complaint.  Patient presents with complaint bilateral bunion deformity right greater than left side.  Patient states has progressive gotten worse.  She states that the bump is started to hurt she has tried all conservative treatment options including padding protecting shoe gear modification.  She would like to discuss surgical options at this time.  She has failed all conservative treatment options.  Pain scale is 7 out of 10 hurts with ambulation ? ?Review of Systems: Negative except as noted in the HPI. Denies N/V/F/Ch. ? ?No past medical history on file. ? ?Current Outpatient Medications:  ?  cephALEXin (KEFLEX) 500 MG capsule, cephalexin 500 mg capsule  TAKE 1 CAPSULE BY MOUTH EVERY 12 HOURS, Disp: , Rfl:  ?  clindamycin (CLEOCIN T) 1 % external solution, Apply a thin layer to the face/chest/back every morning. Do not rinse off., Disp: , Rfl:  ?  HYDROcodone-acetaminophen (NORCO/VICODIN) 5-325 MG tablet, hydrocodone 5 mg-acetaminophen 325 mg tablet, Disp: , Rfl:  ?  ibuprofen (ADVIL) 800 MG tablet, ibuprofen 800 mg tablet, Disp: , Rfl:  ?  ibuprofen (ADVIL,MOTRIN) 600 MG tablet, Take 1 tablet (600 mg total) by mouth every 6 (six) hours as needed., Disp: 30 tablet, Rfl: 0 ?  metroNIDAZOLE (METROGEL) 0.75 % vaginal gel, metronidazole 0.75 % vaginal gel, Disp: , Rfl:  ?  Multiple Vitamin (MULTIVITAMIN WITH MINERALS) TABS tablet, Take 1 tablet by mouth daily., Disp: , Rfl:  ?  multivitamin (VIT W/EXTRA C) CHEW chewable tablet, Chew by mouth., Disp: , Rfl:  ?  paragard intrauterine copper IUD IUD, by Intrauterine route., Disp: , Rfl:  ?  spironolactone (ALDACTONE) 50 MG tablet, spironolactone 50 mg tablet, Disp: , Rfl:  ?  tretinoin (RETIN-A) 0.025 % cream, Apply a thin layer to the face every night. May decrease to every other night if too drying., Disp: ,  Rfl:  ? ?Social History  ? ?Tobacco Use  ?Smoking Status Every Day  ? Packs/day: 0.25  ? Types: Cigarettes  ?Smokeless Tobacco Never  ?Tobacco Comments  ? Smoke 1 pack a week  ? ? ?No Known Allergies ?Objective:  ?There were no vitals filed for this visit. ?There is no height or weight on file to calculate BMI. ?Constitutional Well developed. ?Well nourished.  ?Vascular Dorsalis pedis pulses palpable bilaterally. ?Posterior tibial pulses palpable bilaterally. ?Capillary refill normal to all digits.  ?No cyanosis or clubbing noted. ?Pedal hair growth normal.  ?Neurologic Normal speech. ?Oriented to person, place, and time. ?Epicritic sensation to light touch grossly present bilaterally.  ?Dermatologic Nails well groomed and normal in appearance. ?No open wounds. ?No skin lesions.  ?Orthopedic: Normal joint ROM without pain or crepitus bilaterally. ?Hallux abductovalgus deformity present this is a tracking not a track bound deformity.  No intra-articular first MPJ pain noted. ?Left 1st MPJ diminished range of motion. ?Left 1st TMT with gross hypermobility. ?Right 1st MPJ diminished range of motion  ?Right 1st TMT with gross hypermobility. ?Lesser digital contractures absent bilaterally.  ? ?Radiographs: ?Taken and reviewed. Hallux abductovalgus deformity present. Metatarsal parabola normal. 1st/2nd IMA: Severe greater than 15 degrees; TSP: 5 out of 7.  Increase in hallux abductor's angle.  Some mild cystic changes noted to the medial aspect of the first MPJ joint ? ?Assessment:  ? ?1. Hav (hallux abducto valgus), right   ?2. Encounter for preoperative  examination for general surgical procedure   ? ?Plan:  ?Patient was evaluated and treated and all questions answered. ? ?Hallux abductovalgus deformity, right greater than left side ?-XR as above. ?-Patient has failed all conservative therapy and wishes to proceed with surgical intervention. All risks, benefits, and alternatives discussed with patient. No guarantees  given. Consent reviewed and signed by patient. Post-op course explained at length. ?-Planned procedures: Lapiplasty with phalangeal osteotomy with fixation ?-Risk factors: None ?-I discussed my preoperative intraoperative postoperative plan in extensive detail.  Given the amount of pain that she is experienced setting of all failing all conservative treatment options.  I believe she will benefit from Lapiplasty procedure with Lapidus fusion and a possible phalangeal osteotomy.  I discussed my nonweightbearing period which is 3 weeks followed by weightbearing as tolerated in cam boot.  She states understand like to proceed with surgery ?-Informed surgical risk consent was reviewed and read aloud to the patient.  I reviewed the films.  I have discussed my findings with the patient in great detail.  I have discussed all risks including but not limited to infection, stiffness, scarring, limp, disability, deformity, damage to blood vessels and nerves, numbness, poor healing, need for braces, arthritis, chronic pain, amputation, death.  All benefits and realistic expectations discussed in great detail.  I have made no promises as to the outcome.  I have provided realistic expectations.  I have offered the patient a 2nd o-pinion, which they have declined and assured me they preferred to proceed despite the risks ?- ?We will plan on doing the left side in the future time once the right side has healed as part of a staged procedure ?No follow-ups on file. ? ?

## 2022-03-19 ENCOUNTER — Telehealth: Payer: Self-pay | Admitting: Urology

## 2022-03-19 NOTE — Telephone Encounter (Signed)
DOS - 04/02/22 ? ?[employer] BUNIONECTOMY RIGHT --- [phone] ?[facility] OSTEOTOMY RIGHT --- [phone] ? ?[employer] EFFECTIVE DATE - 11/12/21 ? ?PLAN DEDUCTIBLE - $2,000.00 W/ $1,598.31 REMAINING ?OUT OF POCKET - $6,000.00 W/ $5,538.31 REMAINING ?COINSURANCE - 20% ?COPAY - $0.00 ? ? ?PER [employer] WEBSITE FOR CPT CODES 75102 AND [phone] HAS BEEN APPROVED, AUTH # A [id number], GOOD FROM 04/02/22 - 07/01/22. ?

## 2022-03-27 ENCOUNTER — Telehealth: Payer: Self-pay

## 2022-03-27 NOTE — Telephone Encounter (Signed)
Received call from [place] at [facility]. She stated Teylor call them to cancel surgery with Dr. Allena Katz on 04/02/2022. Elnita stated she will contact us when she is ready to reschedule. Notified Dr. Allena Katz ?

## 2022-04-11 ENCOUNTER — Encounter: Payer: 59 | Admitting: Podiatry

## 2022-04-25 ENCOUNTER — Encounter: Payer: 59 | Admitting: Podiatry

## 2024-05-14 DIAGNOSIS — R229 Localized swelling, mass and lump, unspecified: Secondary | ICD-10-CM | POA: Diagnosis not present

## 2024-05-14 DIAGNOSIS — R238 Other skin changes: Secondary | ICD-10-CM | POA: Diagnosis not present

## 2024-05-14 DIAGNOSIS — R52 Pain, unspecified: Secondary | ICD-10-CM | POA: Diagnosis not present

## 2024-05-14 DIAGNOSIS — R21 Rash and other nonspecific skin eruption: Secondary | ICD-10-CM | POA: Diagnosis not present

## 2024-07-15 DIAGNOSIS — Z113 Encounter for screening for infections with a predominantly sexual mode of transmission: Secondary | ICD-10-CM | POA: Diagnosis not present

## 2024-07-29 ENCOUNTER — Ambulatory Visit: Admitting: Podiatry

## 2024-08-05 ENCOUNTER — Ambulatory Visit: Admitting: Podiatry

## 2024-08-19 ENCOUNTER — Ambulatory Visit: Payer: Self-pay | Admitting: Podiatry

## 2024-09-02 ENCOUNTER — Ambulatory Visit: Payer: Self-pay | Admitting: Podiatry

## 2024-09-02 ENCOUNTER — Ambulatory Visit

## 2024-09-02 DIAGNOSIS — M2011 Hallux valgus (acquired), right foot: Secondary | ICD-10-CM

## 2024-09-02 DIAGNOSIS — M21611 Bunion of right foot: Secondary | ICD-10-CM

## 2024-09-02 DIAGNOSIS — M21619 Bunion of unspecified foot: Secondary | ICD-10-CM | POA: Diagnosis not present

## 2024-09-02 NOTE — Progress Notes (Signed)
 Subjective:  Patient ID: Brittany Burnett, female    DOB: 1986-02-27,  MRN: [id number]  Chief Complaint  Patient presents with   Bunions    Bilateral bunions pt stated that her right foot is worse than the left one    38 y.o. female presents with the above complaint.  Patient presents with complaint bilateral bunion deformity right greater than left side.  Patient states has progressive gotten worse.  She states that the bump is started to hurt she has tried all conservative treatment options including padding protecting shoe gear modification.  She would like to discuss surgical options at this time.  She has failed all conservative treatment options.  Pain scale is 7 out of 10 hurts with ambulation  Review of Systems: Negative except as noted in the HPI. Denies N/V/F/Ch.  No past medical history on file.  Current Outpatient Medications:    cephALEXin (KEFLEX) 500 MG capsule, cephalexin 500 mg capsule  TAKE 1 CAPSULE BY MOUTH EVERY 12 HOURS, Disp: , Rfl:    clindamycin (CLEOCIN T) 1 % external solution, Apply a thin layer to the face/chest/back every morning. Do not rinse off., Disp: , Rfl:    HYDROcodone -acetaminophen  (NORCO/VICODIN) 5-325 MG tablet, hydrocodone  5 mg-acetaminophen  325 mg tablet, Disp: , Rfl:    ibuprofen  (ADVIL ) 800 MG tablet, ibuprofen  800 mg tablet, Disp: , Rfl:    ibuprofen  (ADVIL ,MOTRIN ) 600 MG tablet, Take 1 tablet (600 mg total) by mouth every 6 (six) hours as needed., Disp: 30 tablet, Rfl: 0   metroNIDAZOLE  (METROGEL ) 0.75 % vaginal gel, metronidazole  0.75 % vaginal gel, Disp: , Rfl:    Multiple Vitamin (MULTIVITAMIN WITH MINERALS) TABS tablet, Take 1 tablet by mouth daily., Disp: , Rfl:    multivitamin (VIT W/EXTRA C) CHEW chewable tablet, Chew by mouth., Disp: , Rfl:    paragard intrauterine copper IUD IUD, by Intrauterine route., Disp: , Rfl:    spironolactone (ALDACTONE) 50 MG tablet, spironolactone 50 mg tablet, Disp: , Rfl:    tretinoin (RETIN-A) 0.025 % cream,  Apply a thin layer to the face every night. May decrease to every other night if too drying., Disp: , Rfl:   Social History   Tobacco Use  Smoking Status Every Day   Current packs/day: 0.25   Types: Cigarettes  Smokeless Tobacco Never  Tobacco Comments   Smoke 1 pack a week    No Known Allergies Objective:  There were no vitals filed for this visit. There is no height or weight on file to calculate BMI. Constitutional Well developed. Well nourished.  Vascular Dorsalis pedis pulses palpable bilaterally. Posterior tibial pulses palpable bilaterally. Capillary refill normal to all digits.  No cyanosis or clubbing noted. Pedal hair growth normal.  Neurologic Normal speech. Oriented to person, place, and time. Epicritic sensation to light touch grossly present bilaterally.  Dermatologic Nails well groomed and normal in appearance. No open wounds. No skin lesions.  Orthopedic: Normal joint ROM without pain or crepitus bilaterally. Hallux abductovalgus deformity present this is a tracking not a track bound deformity.  No intra-articular first MPJ pain noted. Left 1st MPJ diminished range of motion. Left 1st TMT with gross hypermobility. Right 1st MPJ diminished range of motion  Right 1st TMT with gross hypermobility. Lesser digital contractures absent bilaterally.   Radiographs: Taken and reviewed. Hallux abductovalgus deformity present. Metatarsal parabola normal. 1st/2nd IMA: Severe greater than 15 degrees; TSP: 5 out of 7.  Increase in hallux abductor's angle.  Some mild cystic changes noted to the  medial aspect of the first MPJ joint  Assessment:   1. Bunion   2. Hav (hallux abducto valgus), right    Plan:  Patient was evaluated and treated and all questions answered.  Hallux abductovalgus deformity, right greater than left side -XR as above. -Patient has failed all conservative therapy and wishes to proceed with surgical intervention. All risks, benefits, and  alternatives discussed with patient. No guarantees given. Consent reviewed and signed by patient. Post-op course explained at length. -Planned procedures: Lapiplasty with phalangeal osteotomy with fixation versus minimally invasive bunionectomy with possible phalangeal osteotomy -Risk factors: None -I discussed my preoperative intraoperative postoperative plan in extensive detail.  Given the amount of pain that she is experienced setting of all failing all conservative treatment options.  I believe she will benefit from Lapiplasty procedure with Lapidus fusion and a possible phalangeal osteotomy.  I discussed my nonweightbearing period which is 3 weeks followed by weightbearing as tolerated in cam boot.  She states understand like to proceed with surgery -I discussed minimally invasive bunion surgery for the patient as well she would like to let me know a week or so in advance before the surgery.  Will decide on which procedure to go with.  She states understanding -Informed surgical risk consent was reviewed and read aloud to the patient.  I reviewed the films.  I have discussed my findings with the patient in great detail.  I have discussed all risks including but not limited to infection, stiffness, scarring, limp, disability, deformity, damage to blood vessels and nerves, numbness, poor healing, need for braces, arthritis, chronic pain, amputation, death.  All benefits and realistic expectations discussed in great detail.  I have made no promises as to the outcome.  I have provided realistic expectations.  I have offered the patient a 2nd o-pinion, which they have declined and assured me they preferred to proceed despite the risks - We will plan on doing the left side in the future time once the right side has healed as part of a staged procedure No follow-ups on file.

## 2024-09-10 ENCOUNTER — Telehealth: Payer: Self-pay | Admitting: Podiatry

## 2024-09-10 NOTE — Telephone Encounter (Signed)
 Called and left message for patient to contact office to schedule appointment.

## 2024-09-18 ENCOUNTER — Telehealth: Payer: Self-pay | Admitting: Podiatry

## 2024-09-18 ENCOUNTER — Ambulatory Visit: Admitting: Family

## 2024-09-18 ENCOUNTER — Encounter: Payer: Self-pay | Admitting: Family

## 2024-09-18 VITALS — BP 151/106 | HR 69 | Temp 98.3°F | Resp 16 | Ht 62.0 in | Wt 195.0 lb

## 2024-09-18 DIAGNOSIS — R635 Abnormal weight gain: Secondary | ICD-10-CM | POA: Diagnosis not present

## 2024-09-18 DIAGNOSIS — Z23 Encounter for immunization: Secondary | ICD-10-CM | POA: Diagnosis not present

## 2024-09-18 DIAGNOSIS — Z7689 Persons encountering health services in other specified circumstances: Secondary | ICD-10-CM

## 2024-09-18 DIAGNOSIS — I10 Essential (primary) hypertension: Secondary | ICD-10-CM

## 2024-09-18 MED ORDER — SEMAGLUTIDE-WEIGHT MANAGEMENT 0.25 MG/0.5ML ~~LOC~~ SOAJ: 0.2500 mg | SUBCUTANEOUS | 0 refills | Status: AC

## 2024-09-18 MED ORDER — HYDROCHLOROTHIAZIDE 12.5 MG PO TABS: 12.5000 mg | ORAL_TABLET | Freq: Every day | ORAL | 0 refills | Status: DC

## 2024-09-18 NOTE — Telephone Encounter (Signed)
 Patient called backa nd surgery is set for 11/23/2024. Patient is not on blood thinners at this time but may be starting on a GLP1. I advised patient if she did to please discontinue use the week prior to surgery. Patient preferred pharmacy is [facility] [address]. Patient aware [facility] will call 24-48 hours prior to surgery with arrival time for facility.

## 2024-09-18 NOTE — Progress Notes (Signed)
 Hormone screening and weight management

## 2024-09-18 NOTE — Progress Notes (Signed)
 Subjective:    Brittany Burnett - 38 y.o. female MRN [id number]  Date of birth: [birth year]  HPI  Brittany Burnett is to establish care.  Current issues and/or concerns: - High blood pressure. States she quit taking Hydrochlorothiazide several months ago due to trying to watch what she eats and exercising. She does not complain of red flag symptoms such as but not limited to chest pain, shortness of breath, worst headache of life, nausea/vomiting.  - Weight gain. She is trying to watch what she eats. She exercises. Would like to try weight loss medication and referral to weight specialist. States she considered if hormones are related to challenges with losing weight.  ROS per HPI    Health Maintenance:  Health Maintenance Due  Topic Date Due   HIV Screening  Never done   Hepatitis C Screening  Never done   HPV VACCINES (1 - 3-dose SCDM series) Never done   Cervical Cancer Screening (HPV/Pap Cotest)  Never done     Past Medical History: Patient Active Problem List   Diagnosis Date Noted   Acne vulgaris 12/01/2019   Dissecting cellulitis of scalp 12/01/2019   Sebaceous cyst 05/28/2018      Social History   reports that she has been smoking cigarettes. She has never used smokeless tobacco. She reports current alcohol use. She reports that she does not use drugs.   Family History  family history is not on file.   Medications: reviewed and updated   Objective:   Physical Exam BP (!) 151/106   Pulse 69   Temp 98.3 F (36.8 C) (Esophageal)   Resp 16   Ht 5' 2 (1.575 m)   Wt 195 lb (88.5 kg)   LMP 08/20/2024 (Exact Date)   SpO2 98%   BMI 35.67 kg/m   Physical Exam HENT:     Head: Normocephalic and atraumatic.     Nose: Nose normal.     Mouth/Throat:     Mouth: Mucous membranes are moist.     Pharynx: Oropharynx is clear.  Eyes:     Extraocular Movements: Extraocular movements intact.     Conjunctiva/sclera: Conjunctivae normal.     Pupils: Pupils are equal,  round, and reactive to light.  Cardiovascular:     Rate and Rhythm: Normal rate and regular rhythm.     Pulses: Normal pulses.     Heart sounds: Normal heart sounds.  Pulmonary:     Effort: Pulmonary effort is normal.     Breath sounds: Normal breath sounds.  Musculoskeletal:        General: Normal range of motion.     Cervical back: Normal range of motion and neck supple.  Neurological:     General: No focal deficit present.     Mental Status: She is alert and oriented to person, place, and time.  Psychiatric:        Mood and Affect: Mood normal.        Behavior: Behavior normal.      Assessment & Plan:  1. Encounter to establish care (Primary) - Patient presents today to establish care. During the interim follow-up with primary provider as scheduled.  - Return for annual physical examination, labs, and health maintenance. Arrive fasting meaning having no food for at least 8 hours prior to appointment. You may have only water or black coffee. Please take scheduled medications as normal.  2. Primary hypertension - Blood pressure not at goal during today's visit. Patient asymptomatic without chest pressure,  chest pain, palpitations, shortness of breath, worst headache of life, and any additional red flag symptoms. - Hydrochlorothiazide as prescribed.  - Routine screening.  - Counseled on blood pressure goal of less than 130/80, low-sodium, DASH diet, medication compliance, and 150 minutes of moderate intensity exercise per week as tolerated. Counseled on medication adherence and adverse effects. - Follow-up with primary provider in 4 weeks or sooner if needed. - hydrochlorothiazide (HYDRODIURIL) 12.5 MG tablet; Take 1 tablet (12.5 mg total) by mouth daily.  Dispense: 90 tablet; Refill: 0 - Basic Metabolic Panel  3. Weight gain - Semaglutide-Weight Management as prescribed. Counseled on medication adherence/adverse effects.  - Routine screening.  - Referral to Medical Weight  Management for evaluation/management.  - Follow-up with primary provider as scheduled.  - TSH - semaglutide-weight management (WEGOVY) 0.25 MG/0.5ML SOAJ SQ injection; Inject 0.25 mg into the skin once a week.  Dispense: 2 mL; Refill: 0 - Follicle stimulating hormone - Luteinizing hormone - Amb Ref to Medical Weight Management  4. Immunization due - Administered. - HPV 9-valent vaccine,Recombinat    Patient was given clear instructions to go to Emergency Department or return to medical center if symptoms don't improve, worsen, or new problems develop.The patient verbalized understanding.  I discussed the assessment and treatment plan with the patient. The patient was provided an opportunity to ask questions and all were answered. The patient agreed with the plan and demonstrated an understanding of the instructions.   The patient was advised to call back or seek an in-person evaluation if the symptoms worsen or if the condition fails to improve as anticipated.    Greig Drones, NP 09/18/2024, 2:12 PM Primary Care at [facility]

## 2024-09-19 LAB — LUTEINIZING HORMONE: LH: 6.6 m[IU]/mL

## 2024-09-19 LAB — BASIC METABOLIC PANEL WITH GFR
BUN/Creatinine Ratio: 7 — ABNORMAL LOW (ref 9–23)
BUN: 6 mg/dL (ref 6–20)
CO2: 20 mmol/L (ref 20–29)
Calcium: 9.8 mg/dL (ref 8.7–10.2)
Chloride: 103 mmol/L (ref 96–106)
Creatinine, Ser: 0.81 mg/dL (ref 0.57–1.00)
Glucose: 80 mg/dL (ref 70–99)
Potassium: 4.1 mmol/L (ref 3.5–5.2)
Sodium: 138 mmol/L (ref 134–144)
eGFR: 95 mL/min/1.73 (ref >59)

## 2024-09-19 LAB — TSH: TSH: 2.18 u[IU]/mL (ref 0.450–4.500)

## 2024-09-19 LAB — FOLLICLE STIMULATING HORMONE: FSH: 4.4 m[IU]/mL

## 2024-09-21 ENCOUNTER — Ambulatory Visit: Payer: Self-pay | Admitting: Family

## 2024-09-24 ENCOUNTER — Encounter (INDEPENDENT_AMBULATORY_CARE_PROVIDER_SITE_OTHER): Payer: Self-pay

## 2024-10-28 ENCOUNTER — Ambulatory Visit: Admitting: Family

## 2024-10-28 ENCOUNTER — Encounter: Payer: Self-pay | Admitting: Family

## 2024-10-28 VITALS — BP 142/106 | HR 93 | Temp 98.0°F | Resp 16 | Ht 62.0 in | Wt 195.8 lb

## 2024-10-28 DIAGNOSIS — Z23 Encounter for immunization: Secondary | ICD-10-CM | POA: Diagnosis not present

## 2024-10-28 DIAGNOSIS — Z1159 Encounter for screening for other viral diseases: Secondary | ICD-10-CM

## 2024-10-28 DIAGNOSIS — Z5320 Procedure and treatment not carried out because of patient's decision for unspecified reasons: Secondary | ICD-10-CM

## 2024-10-28 DIAGNOSIS — Z130 Encounter for screening for diseases of the blood and blood-forming organs and certain disorders involving the immune mechanism: Secondary | ICD-10-CM | POA: Diagnosis not present

## 2024-10-28 DIAGNOSIS — Z13228 Encounter for screening for other metabolic disorders: Secondary | ICD-10-CM | POA: Diagnosis not present

## 2024-10-28 DIAGNOSIS — Z131 Encounter for screening for diabetes mellitus: Secondary | ICD-10-CM

## 2024-10-28 DIAGNOSIS — I10 Essential (primary) hypertension: Secondary | ICD-10-CM | POA: Diagnosis not present

## 2024-10-28 DIAGNOSIS — Z0000 Encounter for general adult medical examination without abnormal findings: Secondary | ICD-10-CM

## 2024-10-28 DIAGNOSIS — Z13220 Encounter for screening for lipoid disorders: Secondary | ICD-10-CM

## 2024-10-28 DIAGNOSIS — Z114 Encounter for screening for human immunodeficiency virus [HIV]: Secondary | ICD-10-CM | POA: Diagnosis not present

## 2024-10-28 MED ORDER — HYDROCHLOROTHIAZIDE 12.5 MG PO TABS: 12.5000 mg | ORAL_TABLET | Freq: Every day | ORAL | 0 refills | Status: AC

## 2024-10-28 NOTE — Progress Notes (Signed)
 Patient ID: Brittany Burnett, female    DOB: Apr 19, 1986  MRN: [id number]  CC: Annual Exam  Subjective: Brittany Burnett is a 38 y.o. female who presents for annual exam.   Her concerns today include:  - Established with Gynecology for women's health maintenance/screenings.  - States she began taking Hydrochlorothiazide  1 week ago and doing well on regimen with no issues/concerns. She does not complain of red flag symptoms such as but not limited to chest pain, shortness of breath, worst headache of life, nausea/vomiting.  - States her and her mother had a discussion and requests labs for estrogen, progesterone , and testosterone . Denies immediate concerns.   Patient Active Problem List   Diagnosis Date Noted   Acne vulgaris 12/01/2019   Dissecting cellulitis of scalp 12/01/2019   Sebaceous cyst 05/28/2018     Medications Ordered Prior to Encounter[1]  Allergies[2]  Social History   Socioeconomic History   Marital status: Single    Spouse name: Not on file   Number of children: Not on file   Years of education: Not on file   Highest education level: Bachelor's degree (e.g., BA, AB, BS)  Occupational History   Not on file  Tobacco Use   Smoking status: Every Day    Current packs/day: 0.25    Types: Cigarettes   Smokeless tobacco: Never   Tobacco comments:    Smoke 1 pack a week  Vaping Use   Vaping status: Never Used  Substance and Sexual Activity   Alcohol use: Yes    Comment: socially   Drug use: Never   Sexual activity: Yes    Birth control/protection: I.U.D.  Other Topics Concern   Not on file  Social History Narrative   Not on file   Social Drivers of Health   Tobacco Use: High Risk (10/28/2024)   Patient History    Smoking Tobacco Use: Every Day    Smokeless Tobacco Use: Never    Passive Exposure: Not on file  Financial Resource Strain: Low Risk (10/24/2024)   Overall Financial Resource Strain (CARDIA)    Difficulty of Paying Living Expenses: Not hard at  all  Food Insecurity: No Food Insecurity (10/24/2024)   Epic    Worried About Programme Researcher, Broadcasting/film/video in the Last Year: Never true    Ran Out of Food in the Last Year: Never true  Transportation Needs: No Transportation Needs (10/24/2024)   Epic    Lack of Transportation (Medical): No    Lack of Transportation (Non-Medical): No  Physical Activity: Inactive (10/24/2024)   Exercise Vital Sign    Days of Exercise per Week: 0 days    Minutes of Exercise per Session: Not on file  Stress: Stress Concern Present (10/24/2024)   [employer] of Occupational Health - Occupational Stress Questionnaire    Feeling of Stress: To some extent  Social Connections: Moderately Isolated (10/24/2024)   Social Connection and Isolation Panel    Frequency of Communication with Friends and Family: More than three times a week    Frequency of Social Gatherings with Friends and Family: Twice a week    Attends Religious Services: More than 4 times per year    Active Member of [employer] or Organizations: No    Attends Banker Meetings: Not on file    Marital Status: Never married  Intimate Partner Violence: Not At Risk (09/18/2024)   Epic    Fear of Current or Ex-Partner: No    Emotionally Abused: No  Physically Abused: No    Sexually Abused: No  Depression (PHQ2-9): Low Risk (10/28/2024)   Depression (PHQ2-9)    PHQ-2 Score: 0  Alcohol Screen: Low Risk (10/24/2024)   Alcohol Screen    Last Alcohol Screening Score (AUDIT): 3  Housing: Low Risk (10/24/2024)   Epic    Unable to Pay for Housing in the Last Year: No    Number of Times Moved in the Last Year: 0    Homeless in the Last Year: No  Utilities: Not At Risk (09/18/2024)   Epic    Threatened with loss of utilities: No  Health Literacy: Adequate Health Literacy (09/18/2024)   B1300 Health Literacy    Frequency of need for help with medical instructions: Never    History reviewed. No pertinent family history.  Past Surgical History:   Procedure Laterality Date   LESION REMOVAL N/A 06/06/2018   Procedure: Excision benign lesion scalp 2 cm, layered closure scalp 5 cm;  Surgeon: Arelia Filippo, MD;  Location: [facility];  Service: Plastics;  Laterality: N/A;    ROS: Review of Systems Negative except as stated above  PHYSICAL EXAM: BP (!) 142/106   Pulse 93   Temp 98 F (36.7 C) (Oral)   Resp 16   Ht 5' 2 (1.575 m)   Wt 195 lb 12.8 oz (88.8 kg)   LMP 10/22/2024 (Exact Date)   SpO2 98%   BMI 35.81 kg/m   Physical Exam HENT:     Head: Normocephalic and atraumatic.     Right Ear: Tympanic membrane, ear canal and external ear normal.     Left Ear: Tympanic membrane, ear canal and external ear normal.     Nose: Nose normal.     Mouth/Throat:     Mouth: Mucous membranes are moist.     Pharynx: Oropharynx is clear.  Eyes:     Extraocular Movements: Extraocular movements intact.     Conjunctiva/sclera: Conjunctivae normal.     Pupils: Pupils are equal, round, and reactive to light.  Neck:     Thyroid: No thyroid mass, thyromegaly or thyroid tenderness.  Cardiovascular:     Rate and Rhythm: Normal rate and regular rhythm.     Pulses: Normal pulses.     Heart sounds: Normal heart sounds.  Pulmonary:     Effort: Pulmonary effort is normal.     Breath sounds: Normal breath sounds.  Chest:     Comments: Patient declined.  Abdominal:     General: Bowel sounds are normal.     Palpations: Abdomen is soft.  Genitourinary:    Comments: Patient declined.  Musculoskeletal:        General: Normal range of motion.     Right shoulder: Normal.     Left shoulder: Normal.     Right upper arm: Normal.     Left upper arm: Normal.     Right elbow: Normal.     Left elbow: Normal.     Right forearm: Normal.     Left forearm: Normal.     Right wrist: Normal.     Left wrist: Normal.     Right hand: Normal.     Left hand: Normal.     Cervical back: Normal, normal range of motion and neck supple.      Thoracic back: Normal.     Lumbar back: Normal.     Right hip: Normal.     Left hip: Normal.     Right upper leg: Normal.  Left upper leg: Normal.     Right knee: Normal.     Left knee: Normal.     Right lower leg: Normal.     Left lower leg: Normal.     Right ankle: Normal.     Left ankle: Normal.     Right foot: Normal.     Left foot: Normal.  Skin:    General: Skin is warm and dry.     Capillary Refill: Capillary refill takes less than 2 seconds.  Neurological:     General: No focal deficit present.     Mental Status: She is alert and oriented to person, place, and time.  Psychiatric:        Mood and Affect: Mood normal.        Behavior: Behavior normal.     ASSESSMENT AND PLAN: 1. Annual physical exam (Primary) - Counseled on 150 minutes of exercise per week as tolerated, healthy eating (including decreased daily intake of saturated fats, cholesterol, added sugars, sodium), STI prevention, and routine healthcare maintenance.  2. Screening for metabolic disorder - Routine screening.  - Hepatic Function Panel - Estrogens , total - Progesterone  - Testosterone   3. Screening for deficiency anemia - Routine screening.  - CBC  4. Diabetes mellitus screening - Routine screening.  - Hemoglobin A1c  5. Screening cholesterol level - Routine screening.  - Lipid panel  6. Encounter for screening for HIV - Routine screening.  - HIV antibody (with reflex)  7. Need for hepatitis C screening test - Routine screening.  - Hepatitis C Antibody  8. Cervical cancer screening declined - Keep all scheduled appointments with established Gynecology.   9. Primary hypertension - Routine screening.  - Patient reports she has been taking Hydrochlorothiazide  for only 1 week.  - Continue Hydrochlorothiazide  as prescribed.  - Counseled on blood pressure goal of less than 130/80, low-sodium, DASH diet, medication compliance, and 150 minutes of moderate intensity exercise per week  as tolerated. Counseled on medication adherence and adverse effects. - Follow-up with primary provider in 4 weeks or sooner if needed. - hydrochlorothiazide  (HYDRODIURIL ) 12.5 MG tablet; Take 1 tablet (12.5 mg total) by mouth daily.  Dispense: 90 tablet; Refill: 0   Patient was given the opportunity to ask questions.  Patient verbalized understanding of the plan and was able to repeat key elements of the plan. Patient was given clear instructions to go to Emergency Department or return to medical center if symptoms don't improve, worsen, or new problems develop.The patient verbalized understanding.   Orders Placed This Encounter  Procedures   HPV 9-valent vaccine,Recombinat   HIV antibody (with reflex)   Hepatitis C Antibody   CBC   Lipid panel   Hemoglobin A1c   Hepatic Function Panel   Estrogens , total   Progesterone    Testosterone      Requested Prescriptions   Signed Prescriptions Disp Refills   hydrochlorothiazide  (HYDRODIURIL ) 12.5 MG tablet 90 tablet 0    Sig: Take 1 tablet (12.5 mg total) by mouth daily.    Return in about 1 year (around 10/28/2025) for Physical per patient preference and 4 weeks or next available chronic conditions.  Greig JINNY Chute, NP      [1]  Current Outpatient Medications on File Prior to Visit  Medication Sig Dispense Refill   cephALEXin (KEFLEX) 500 MG capsule cephalexin 500 mg capsule  TAKE 1 CAPSULE BY MOUTH EVERY 12 HOURS     clindamycin (CLEOCIN T) 1 % external solution Apply a thin layer to  the face/chest/back every morning. Do not rinse off.     HYDROcodone -acetaminophen  (NORCO/VICODIN) 5-325 MG tablet hydrocodone  5 mg-acetaminophen  325 mg tablet     ibuprofen  (ADVIL ) 800 MG tablet ibuprofen  800 mg tablet     ibuprofen  (ADVIL ,MOTRIN ) 600 MG tablet Take 1 tablet (600 mg total) by mouth every 6 (six) hours as needed. 30 tablet 0   metroNIDAZOLE  (METROGEL ) 0.75 % vaginal gel metronidazole  0.75 % vaginal gel     Multiple Vitamin  (MULTIVITAMIN WITH MINERALS) TABS tablet Take 1 tablet by mouth daily.     multivitamin (VIT W/EXTRA C) CHEW chewable tablet Chew by mouth.     paragard intrauterine copper IUD IUD by Intrauterine route.     semaglutide -weight management (WEGOVY ) 0.25 MG/0.5ML SOAJ SQ injection Inject 0.25 mg into the skin once a week. 2 mL 0   spironolactone (ALDACTONE) 50 MG tablet spironolactone 50 mg tablet     tretinoin (RETIN-A) 0.025 % cream Apply a thin layer to the face every night. May decrease to every other night if too drying.     No current facility-administered medications on file prior to visit.  [2] No Known Allergies

## 2024-10-28 NOTE — Addendum Note (Signed)
 Addended by: FRANCHOT PURVIS CROME on: 10/28/2024 12:01 PM   Modules accepted: Orders

## 2024-10-28 NOTE — Progress Notes (Signed)
 Annual Exam and hormone testing to see what her levels are

## 2024-10-29 LAB — PROGESTERONE: Progesterone: 0.3 ng/mL

## 2024-10-29 LAB — HIV ANTIBODY (ROUTINE TESTING W REFLEX): HIV Screen 4th Generation wRfx: NONREACTIVE

## 2024-10-29 LAB — CBC
Hematocrit: 44.3 % (ref 34.0–46.6)
Hemoglobin: 14.1 g/dL (ref 11.1–15.9)
MCH: 27.6 pg (ref 26.6–33.0)
MCHC: 31.8 g/dL (ref 31.5–35.7)
MCV: 87 fL (ref 79–97)
Platelets: 434 x10E3/uL (ref 150–450)
RBC: 5.1 x10E6/uL (ref 3.77–5.28)
RDW: 12 % (ref 11.7–15.4)
WBC: 5.2 x10E3/uL (ref 3.4–10.8)

## 2024-10-29 LAB — LIPID PANEL
Chol/HDL Ratio: 2.6 ratio (ref 0.0–4.4)
Cholesterol, Total: 148 mg/dL (ref 100–199)
HDL: 58 mg/dL (ref >39)
LDL Chol Calc (NIH): 80 mg/dL (ref 0–99)
Triglycerides: 43 mg/dL (ref 0–149)
VLDL Cholesterol Cal: 10 mg/dL (ref 5–40)

## 2024-10-29 LAB — HEMOGLOBIN A1C
Est. average glucose Bld gHb Est-mCnc: 105 mg/dL
Hgb A1c MFr Bld: 5.3 % (ref 4.8–5.6)

## 2024-10-29 LAB — HEPATIC FUNCTION PANEL
ALT: 10 IU/L (ref 0–32)
AST: 20 IU/L (ref 0–40)
Albumin: 4.5 g/dL (ref 3.9–4.9)
Alkaline Phosphatase: 62 IU/L (ref 41–116)
Bilirubin Total: 0.3 mg/dL (ref 0.0–1.2)
Bilirubin, Direct: 0.08 mg/dL (ref 0.00–0.40)
Total Protein: 7.9 g/dL (ref 6.0–8.5)

## 2024-10-29 LAB — TESTOSTERONE: Testosterone: 30 ng/dL (ref 8–60)

## 2024-10-29 LAB — HEPATITIS C ANTIBODY: Hep C Virus Ab: NONREACTIVE

## 2024-10-29 LAB — ESTROGENS, TOTAL: Estrogen: 134 pg/mL

## 2024-10-30 ENCOUNTER — Ambulatory Visit: Payer: Self-pay | Admitting: Family

## 2024-11-13 ENCOUNTER — Encounter: Payer: Self-pay | Admitting: Podiatry

## 2024-11-16 ENCOUNTER — Encounter: Payer: Self-pay | Admitting: Podiatry

## 2024-11-16 ENCOUNTER — Telehealth: Payer: Self-pay | Admitting: Podiatry

## 2024-11-16 NOTE — Telephone Encounter (Signed)
 Pt req work note for her to be out of work until approx 12/14/24 DOS 11/23/24

## 2024-11-16 NOTE — Telephone Encounter (Signed)
 DOS- 11/23/2024  AIKEN OSTEOTOMY RT- 71689 LAPIDUS PROCEDURE INCLUDING BUNIONECTOMY RT- 71702 OSTEOTOMY OF FIRST METATARSAL RT- 28306  [employer] EFFECTIVE DATE- 07/13/2024  DEDUCTIBLE- $3500 REMAINING- $3358.85 OOP- $6500 REMAINING- $3761.26 COINSURANCE- 40%  PER CARELON PORTAL, PRIOR AUTH FOR CPT CODES 71689, [phone], AND [phone] HAVE BEEN APPROVED THROUGH 11/23/2024-01/21/2025. AUTH# [id number]

## 2024-11-23 ENCOUNTER — Other Ambulatory Visit: Payer: Self-pay | Admitting: Podiatry

## 2024-11-23 DIAGNOSIS — M2011 Hallux valgus (acquired), right foot: Secondary | ICD-10-CM | POA: Diagnosis not present

## 2024-11-23 MED ORDER — IBUPROFEN 800 MG PO TABS: 800.0000 mg | ORAL_TABLET | Freq: Four times a day (QID) | ORAL | 1 refills | Status: AC | PRN

## 2024-11-23 MED ORDER — OXYCODONE-ACETAMINOPHEN 5-325 MG PO TABS: 1.0000 | ORAL_TABLET | ORAL | 0 refills | Status: DC | PRN

## 2024-12-02 ENCOUNTER — Ambulatory Visit (INDEPENDENT_AMBULATORY_CARE_PROVIDER_SITE_OTHER): Admitting: Podiatry

## 2024-12-02 ENCOUNTER — Ambulatory Visit

## 2024-12-02 DIAGNOSIS — M21619 Bunion of unspecified foot: Secondary | ICD-10-CM

## 2024-12-02 DIAGNOSIS — M21611 Bunion of right foot: Secondary | ICD-10-CM | POA: Diagnosis not present

## 2024-12-02 NOTE — Progress Notes (Signed)
 Patient presents for post-op visit today, POV #1 DOS 11/23/2024 RT AIKEN OSTEOTOMY, RT LAPIDUS PROCEDURE INC. BUNIONECTOMY, RT OSTEOTOMY OF FIRST METATARSAL  Doing great. Once the feeling came back it was itchy. Where the bunion was it tingles a little. I kept taking 2-3 ibuprofen  for the first 4 days..  Notes: n/a  Vital Signs: 0/10 pain level     Radiographs: [x]  Taken []  Not taken  Surgical Site Assessment:  - Dressing:  [x]  Minimal dry blood, intact []  Reinforced   []  Changed     -Notes: n/a  - Incision:  [x]  CDI (clean, dry, intact)  [x]  Mild erythema  []  Drainage noted   -Notes: n/a  - Swelling:  []  None  [x]  Mild  []  Moderate   []  Significant     -Notes: n/a  - Bruising:  []  None  [x]  Present: dorsal aspect of foot, arch area, base of 1st hallux   - Sutures/Staples:  []  None [x]  Intact  []  Removed Today  [x]  Plan to remove at next visit   -Cast/Splint/Pins: [x]  None []  Intact []  Removed Today []  Plan to remove at next visit []  Replaced  -Signs of infection:  [x]  None  []  Present - Describe: n/a  -DME:    []  None [x]  AFW []  Surgical shoe []  Cast  []  Splint  -Walking status:  []  Full WB  [x]  Partial WB  []  NWB  -Utilizing device:  [x]  None []  Knee Scooter []  Crutches []  Wheelchair    DVT assessment:  [x]  Denies symptoms []  Chest pain/SOB []  Pain in calf/redness/warmth   Redressed DSD and ace wrap. Educated on signs of infection, proper dressing care, pain management, and weight bearing status. Patient will contact provider with any new or worsening symptoms. The provider assessed the patient today and reviewed instructions regarding plan of care.

## 2024-12-04 ENCOUNTER — Telehealth: Payer: Self-pay | Admitting: Lab

## 2024-12-04 NOTE — Telephone Encounter (Signed)
 Patient states had surgery the first week in January wants to know if she can shower and clean foot?

## 2024-12-04 NOTE — Telephone Encounter (Signed)
 Spoke to patient has been advised not to wet foot as of yet until sutures are removed.

## 2024-12-16 ENCOUNTER — Encounter

## 2024-12-16 ENCOUNTER — Ambulatory Visit: Admitting: Podiatry

## 2024-12-16 DIAGNOSIS — M2011 Hallux valgus (acquired), right foot: Secondary | ICD-10-CM

## 2024-12-16 DIAGNOSIS — Z9889 Other specified postprocedural states: Secondary | ICD-10-CM

## 2024-12-16 NOTE — Progress Notes (Unsigned)
 Healing great will see back in 4 weeks for final

## 2024-12-17 ENCOUNTER — Encounter (INDEPENDENT_AMBULATORY_CARE_PROVIDER_SITE_OTHER): Payer: Self-pay

## 2024-12-29 ENCOUNTER — Ambulatory Visit: Payer: Self-pay | Admitting: Family

## 2025-01-20 ENCOUNTER — Encounter: Admitting: Podiatry

## 2025-10-29 ENCOUNTER — Encounter: Payer: Self-pay | Admitting: Family
